# Patient Record
Sex: Male | Born: 1980 | Race: Black or African American | Hispanic: No | Marital: Married
Health system: Southern US, Community
[De-identification: ages and names within clinical notes are randomized; demographics above are authoritative.]

---
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
## 2008-01-26 ENCOUNTER — Encounter: Admission: RE | Admit: 2008-01-26 | Discharge: 2008-01-26 | Payer: Self-pay | Admitting: Family Medicine

## 2008-02-25 ENCOUNTER — Emergency Department (HOSPITAL_COMMUNITY): Admission: EM | Admit: 2008-02-25 | Discharge: 2008-02-25 | Payer: Self-pay | Admitting: *Deleted

## 2009-06-30 IMAGING — CR DG KNEE 1-2V*R*
2 series · 2 of 2 positions shown · non-contrast
Comparison: None

CLINICAL DATA: Right lateral knee pain

RIGHT KNEE - 1-2 VIEW

[view not recorded (1 of 2)]
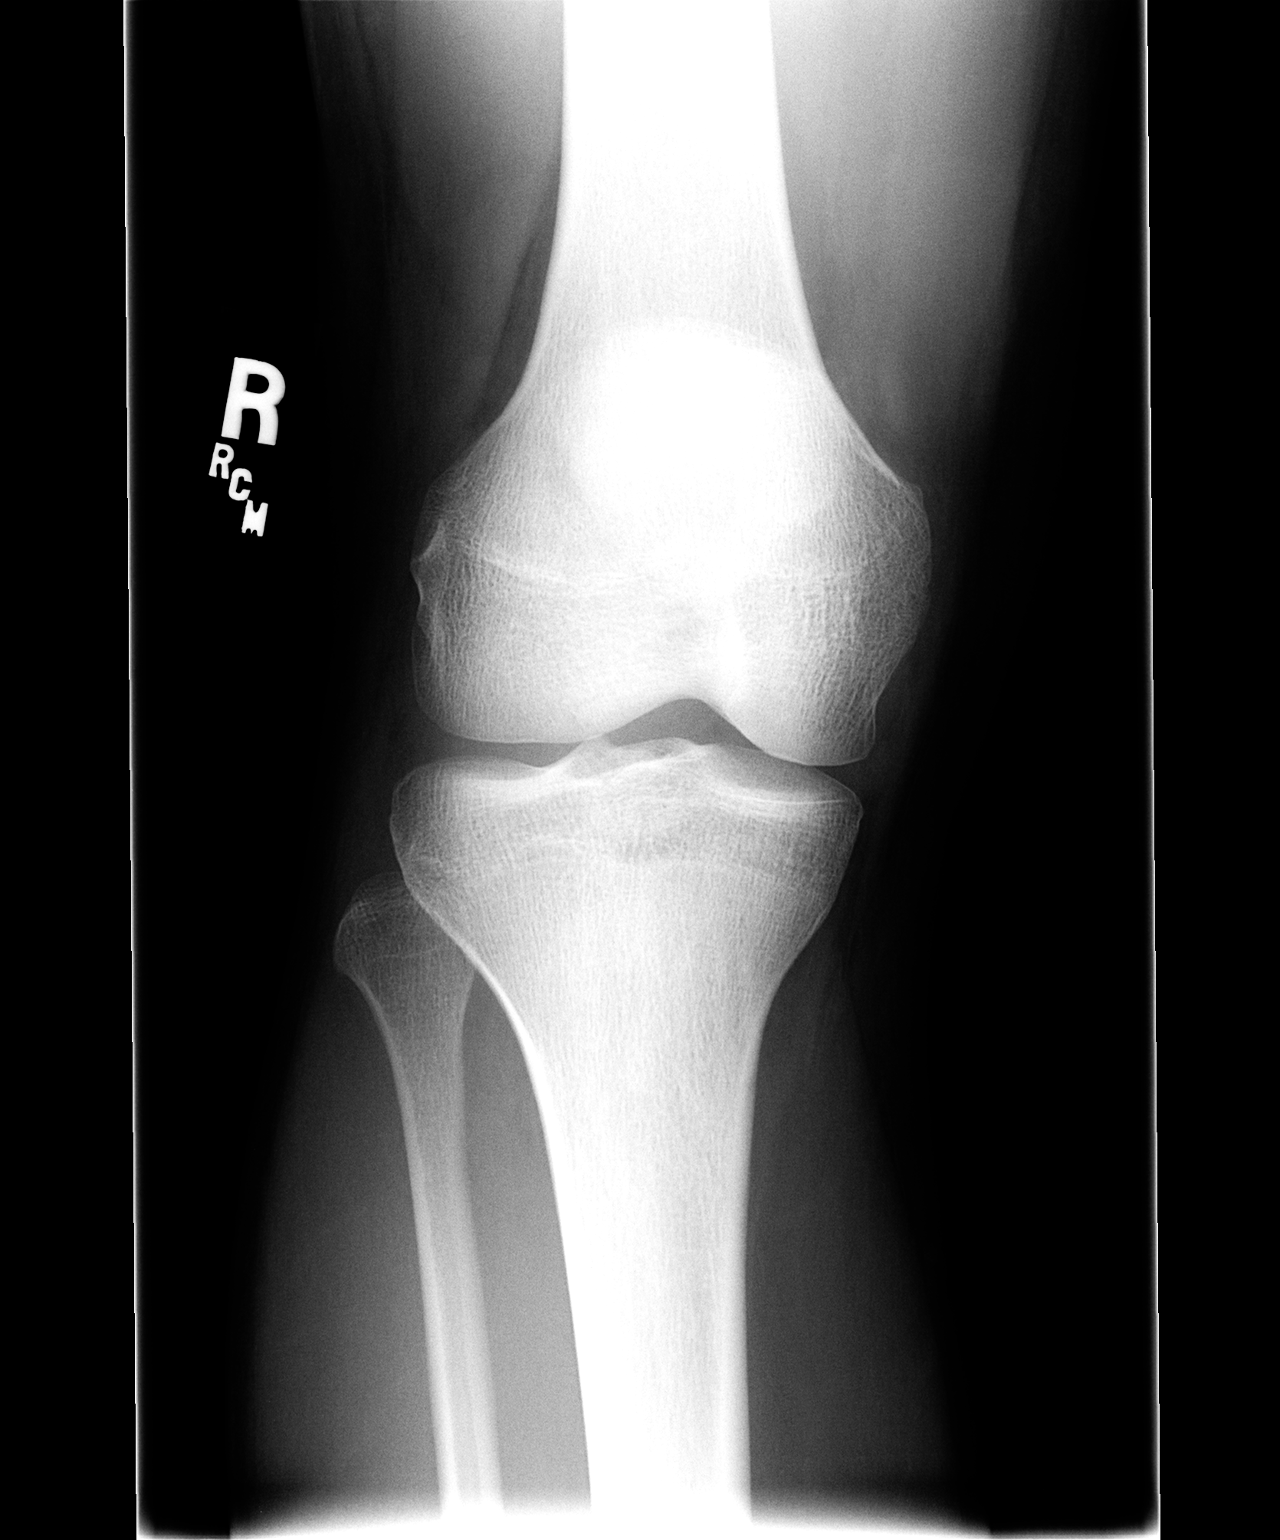

[view not recorded (2 of 2)]
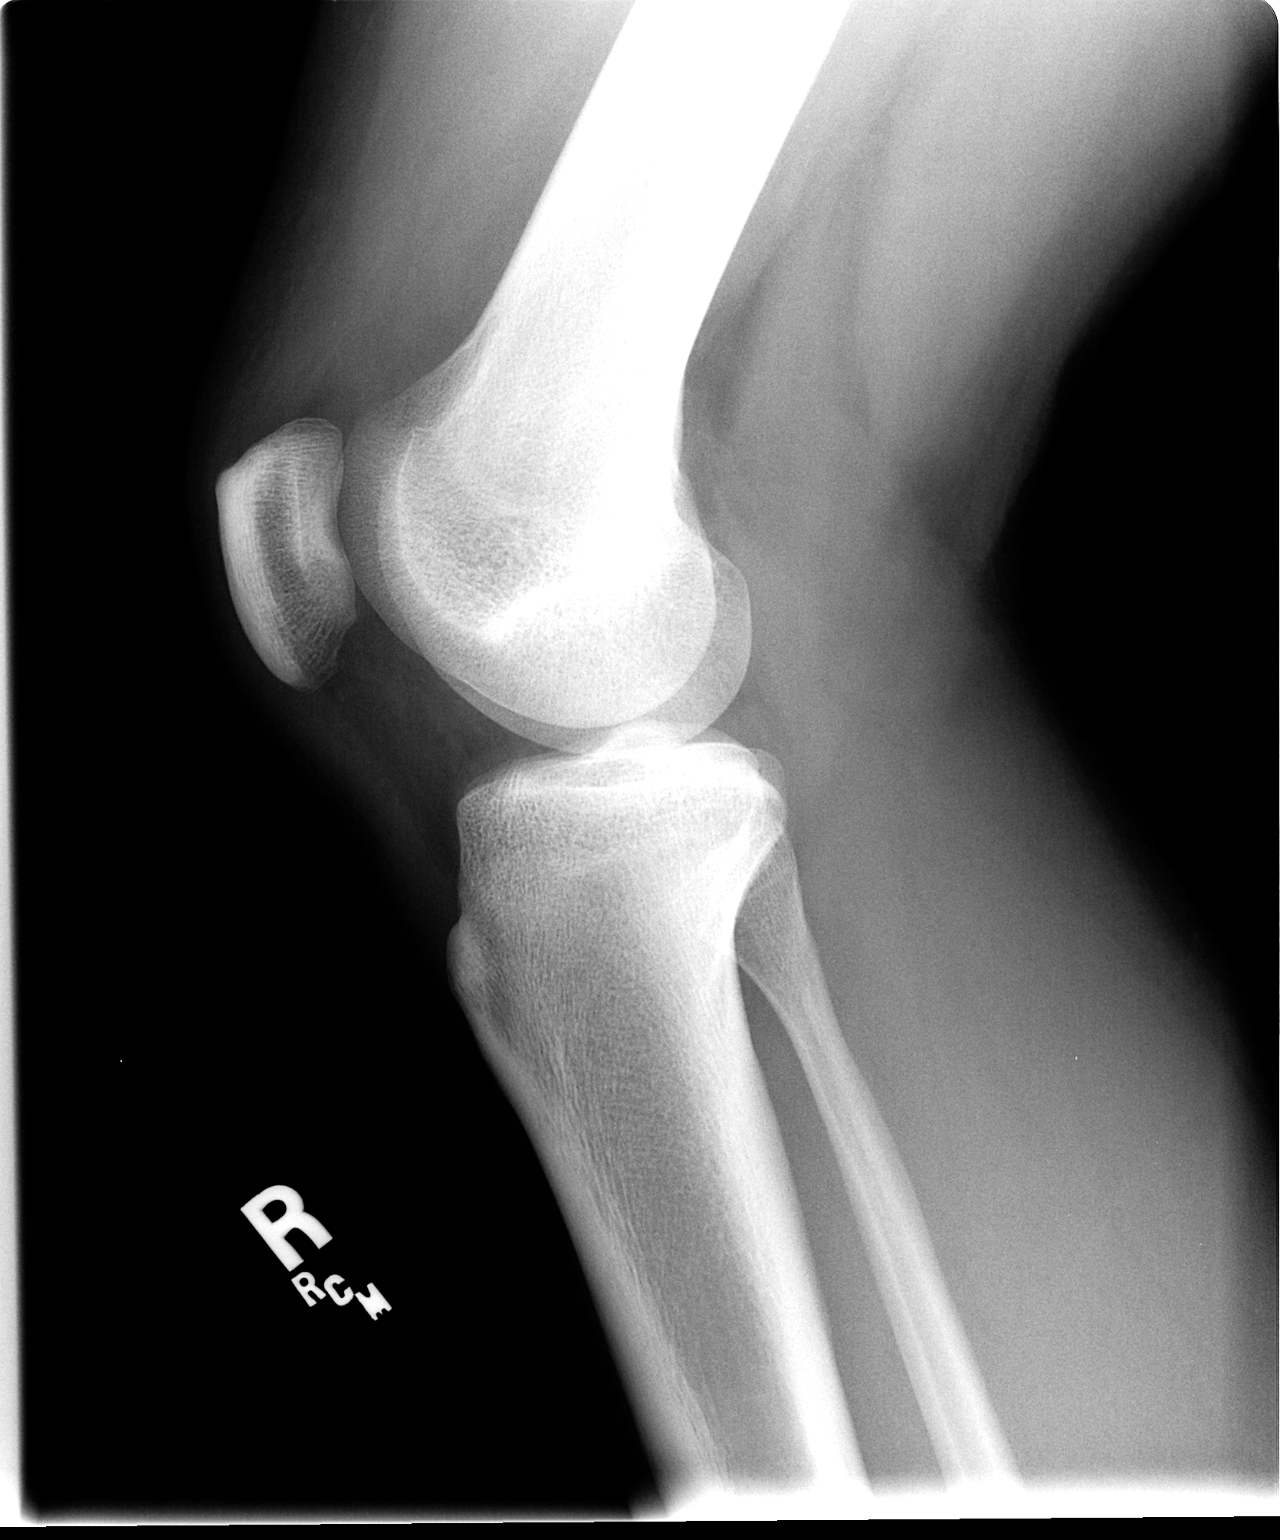

[2 of 2 positions shown; findings below may reference images not displayed]

FINDINGS: Two-view exam shows no fracture.  No evidence for
subluxation.  There is no focal lytic or sclerotic osseous lesion.
No joint effusion in the suprapatellar bursa.
IMPRESSION: Normal exam.

## 2018-08-03 ENCOUNTER — Other Ambulatory Visit: Payer: Self-pay | Admitting: Family Medicine

## 2018-08-03 DIAGNOSIS — K429 Umbilical hernia without obstruction or gangrene: Secondary | ICD-10-CM

## 2018-08-07 ENCOUNTER — Ambulatory Visit
Admission: RE | Admit: 2018-08-07 | Discharge: 2018-08-07 | Disposition: A | Discharge status: Home / Self Care | Payer: BC Managed Care – PPO | Source: Ambulatory Visit | Attending: Family Medicine | Admitting: Family Medicine

## 2018-08-07 DIAGNOSIS — K429 Umbilical hernia without obstruction or gangrene: Secondary | ICD-10-CM

## 2018-08-18 ENCOUNTER — Ambulatory Visit: Payer: Self-pay | Admitting: Surgery

## 2018-08-18 NOTE — H&P (Signed)
History of Present Illness Tommy Reeves. Tommy Kimery MD; 08/18/2018 1:41 PM) The patient is a 38 year old male who presents with an umbilical hernia. Referred by Dr. Charlesetta Reeves for umbilical hernia  This is a 38 year old male in good health who presents with a history of tenderness and swelling at the upper umbilicus. The patient is fairly active. He does not remember single event that causes swelling. This area has become fairly tender and he has been limiting his level of activity. Ultrasound confirmed the presence of an umbilical hernia containing only fat. He is now referred to [place] for surgical evaluation for repair.  CLINICAL DATA: Pain at umbilicus  EXAM: ULTRASOUND ABDOMEN LIMITED  COMPARISON: None.  FINDINGS: Soft tissue mass is noted superior to the umbilicus measuring 2.7 x 3.0 x 1.4 cm. This likely reflects reflects hernia. This appears to contain fat. No visible bowel.  IMPRESSION: Midline hernia measuring up to 3 cm in the area of pain. This could be umbilical or supraumbilical. This appears to contain fat.   Electronically Signed By: Tommy Reeves M.D. On: 08/07/2018 20:46    Past Surgical History Tommy Reeves, Tommy Reeves; 08/18/2018 10:30 AM) Oral Surgery  Diagnostic Studies History Tommy Reeves, Tommy Reeves; 08/18/2018 10:30 AM) Colonoscopy never  Allergies Tommy Reeves, Tommy Reeves; 08/18/2018 10:31 AM) No Known Drug Allergies [08/18/2018]: Allergies Reconciled  Medication History Tommy Reeves, Tommy Reeves; 08/18/2018 10:31 AM) Medications Reconciled  Social History Tommy Reeves, Tommy Reeves; 08/18/2018 10:30 AM) Alcohol use Occasional alcohol use. Caffeine use Carbonated beverages, Coffee. No drug use Tobacco use Former smoker.  Family History Tommy Reeves, Tommy Reeves; 08/18/2018 10:30 AM) Family history unknown First Degree Relatives  Other Problems Tommy Reeves, Tommy Reeves; 08/18/2018 10:30 AM) Umbilical Hernia Repair     Review of Systems Tommy Reeves  Tommy Reeves; 08/18/2018 10:30 AM) General Not Present- Appetite Loss, Chills, Fatigue, Fever, Night Sweats, Weight Gain and Weight Loss. Skin Not Present- Change in Wart/Mole, Dryness, Hives, Jaundice, Tommy Reeves, Non-Healing Wounds, Rash and Ulcer. Respiratory Not Present- Bloody sputum, Chronic Cough, Difficulty Breathing, Snoring and Wheezing. Breast Not Present- Breast Mass, Breast Pain, Nipple Discharge and Skin Changes. Gastrointestinal Present- Abdominal Pain. Not Present- Bloating, Bloody Stool, Change in Bowel Habits, Chronic diarrhea, Constipation, Difficulty Swallowing, Excessive gas, Gets full quickly at meals, Hemorrhoids, Indigestion, Nausea, Rectal Pain and Vomiting. Male Genitourinary Not Present- Blood in Urine, Change in Urinary Stream, Frequency, Impotence, Nocturia, Painful Urination, Urgency and Urine Leakage. Musculoskeletal Not Present- Back Pain, Joint Pain, Joint Stiffness, Muscle Pain, Muscle Weakness and Swelling of Extremities. Neurological Not Present- Decreased Memory, Fainting, Headaches, Numbness, Seizures, Tingling, Tremor, Trouble walking and Weakness. Psychiatric Not Present- Anxiety, Bipolar, Change in Sleep Pattern, Depression, Fearful and Frequent crying. Endocrine Not Present- Cold Intolerance, Excessive Hunger, Hair Changes, Heat Intolerance, Hot flashes and Tommy Reeves. Hematology Not Present- Blood Thinners, Easy Bruising, Excessive bleeding, Gland problems, HIV and Persistent Infections.  Vitals Tommy Reeves Tommy Reeves; 08/18/2018 10:31 AM) 08/18/2018 10:31 AM Weight: 232 lb Height: 70in Body Surface Area: 2.22 m Body Mass Index: 33.29 kg/m  Temp.: 98.70F  Pulse: 71 (Regular)  BP: 182/90 (Sitting, Left Arm, Standard)      Physical Exam Tommy Reeves K. Tommy Thurman MD; 08/18/2018 1:41 PM)  The physical exam findings are as follows: Note:WDWN in NAD Eyes: Pupils equal, round; sclera anicteric HENT: Oral mucosa moist; good dentition Neck: No masses  palpated, no thyromegaly Lungs: CTA bilaterally; normal respiratory effort CV: Regular rate and rhythm; no murmurs; extremities well-perfused with no edema Abd: +bowel sounds, soft, non-tender, no palpable organomegaly;  protruding umbilical hernia - reducible - 1.5 cm defect Skin: Warm, dry; no sign of jaundice Psychiatric - alert and oriented x 4; calm mood and affect    Assessment & Plan Tommy Reeves K. Tommy Beman MD; 08/18/2018 10:55 AM)  UMBILICAL HERNIA WITHOUT OBSTRUCTION OR GANGRENE (K42.9)  Current Plans Schedule for Surgery - Umbilical hernia repair with mesh. The surgical procedure has been discussed with the patient. Potential risks, benefits, alternative treatments, and expected outcomes have been explained. All of the patient's questions at this time have been answered. The likelihood of reaching the patient's treatment goal is good. The patient understand the proposed surgical procedure and wishes to proceed.   Tommy Reeves. Tommy Skains, MD, [facility]  General/ Trauma Surgery Beeper [phone]  08/18/2018 1:41 PM

## 2019-07-13 IMAGING — US US ABDOMEN LIMITED
1 series · 10 of 10 positions shown · non-contrast
Comparison: None.

CLINICAL DATA: Pain at umbilicus

EXAM:
ULTRASOUND ABDOMEN LIMITED

[Series 1: us abdomen limited · 0.05mm/px · 10 acquisitions, 10 frames shown]
[im 1/10]
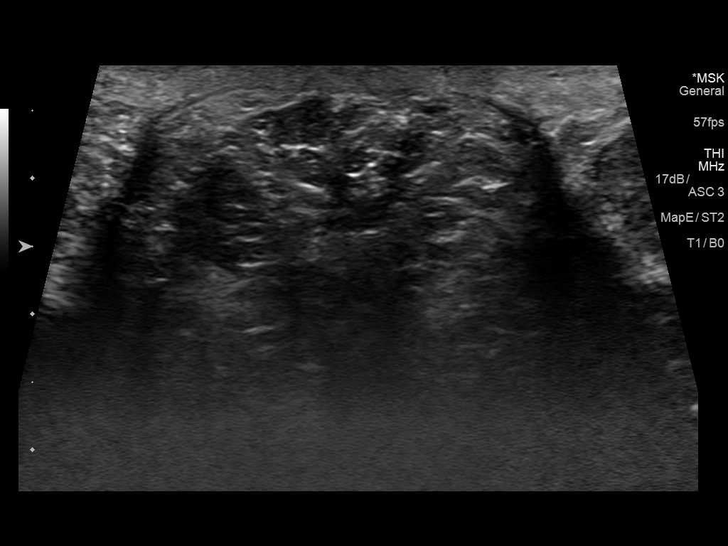
[im 2/10]
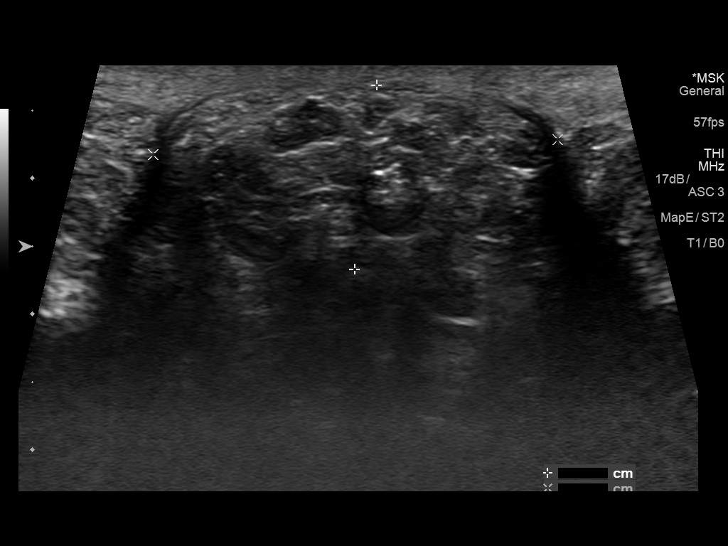
[im 3/10]
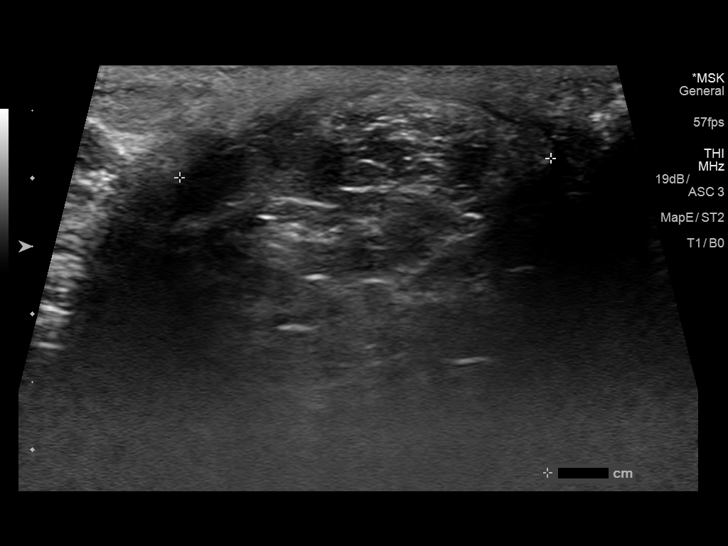
[im 4/10]
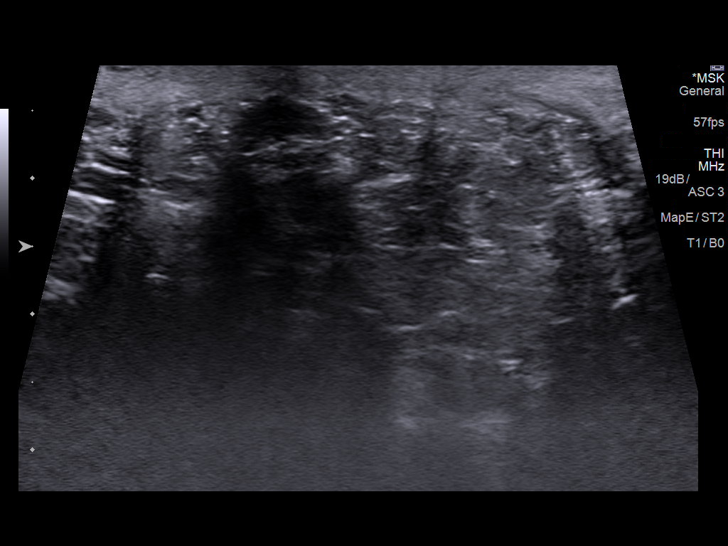
[im 5/10]
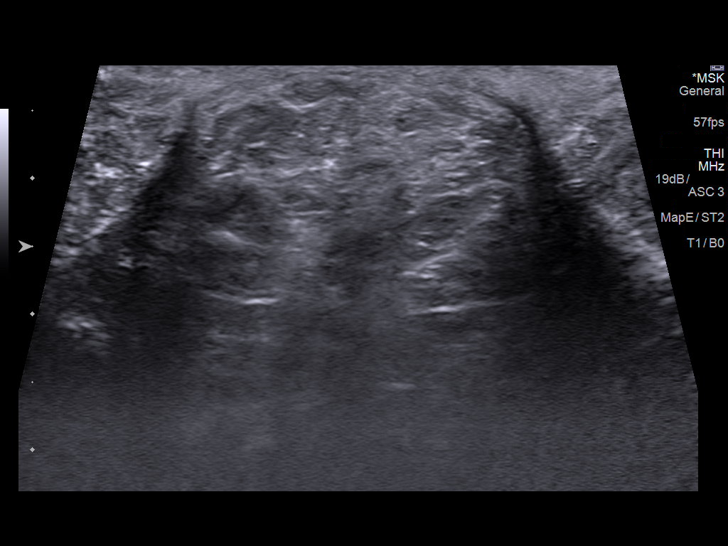
[im 6/10]
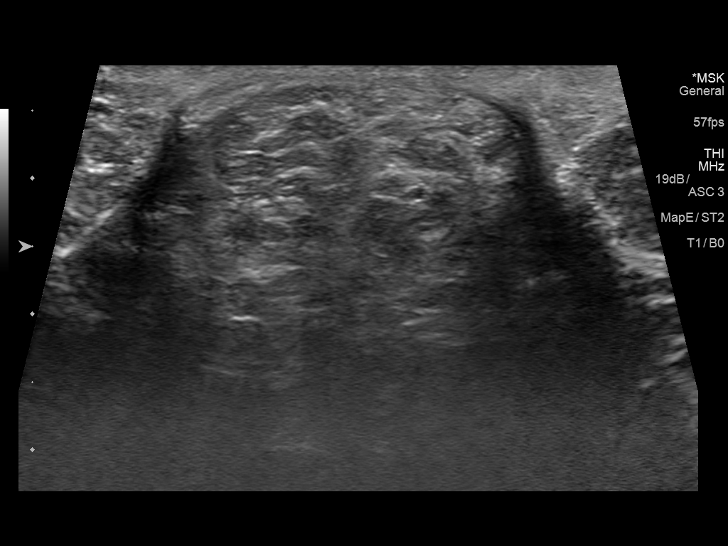
[im 7/10]
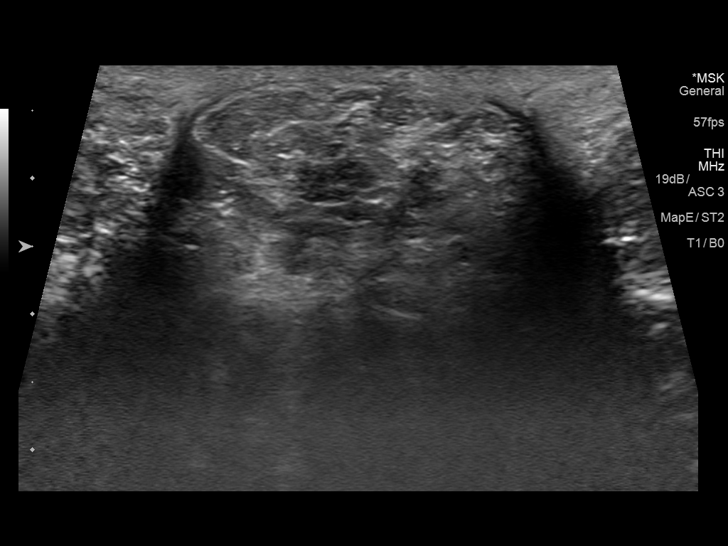
[im 8/10]
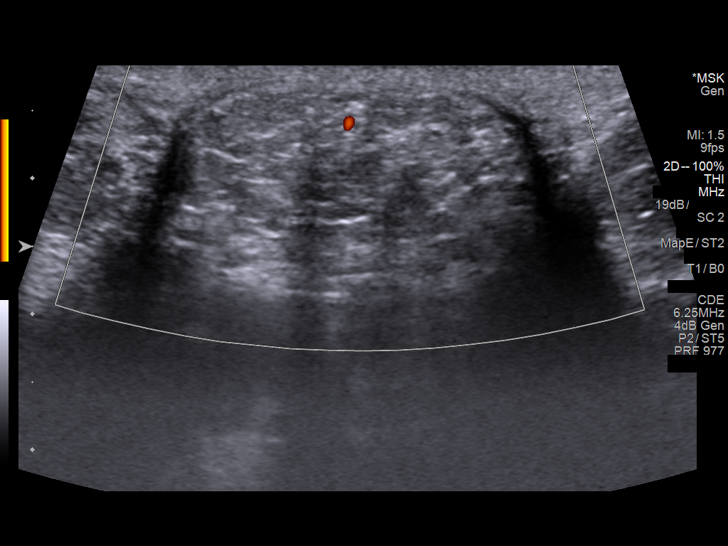
[im 9/10]
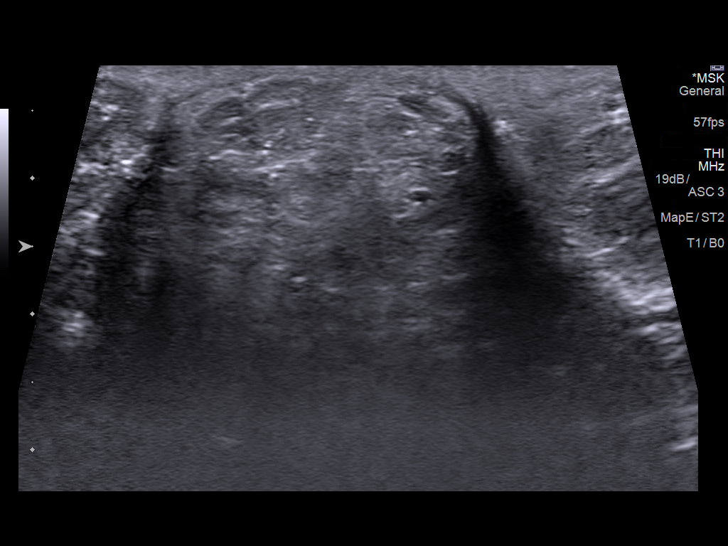
[im 10/10]
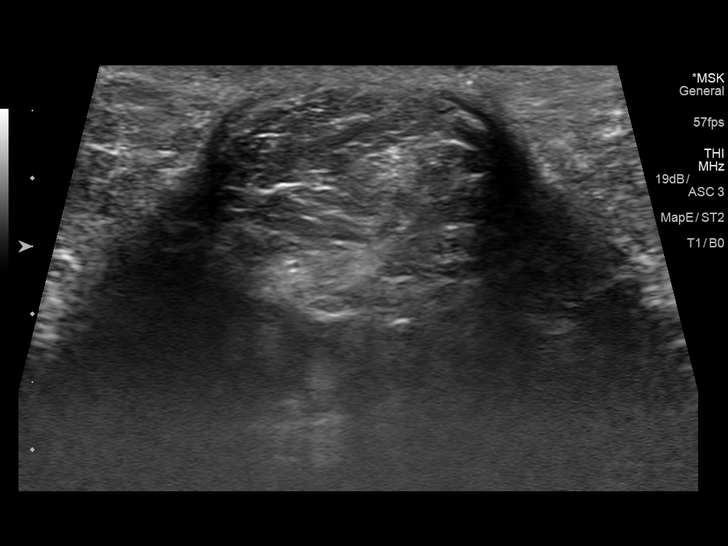

[10 of 10 positions shown; findings below may reference images not displayed]

FINDINGS: Soft tissue mass is noted superior to the umbilicus measuring 2.7 x
3.0 x 1.4 cm. This likely reflects reflects hernia. This appears to
contain fat. No visible bowel.
IMPRESSION: Midline hernia measuring up to 3 cm in the area of pain. This could
be umbilical or supraumbilical. This appears to contain fat.
